# Patient Record
Sex: Male | Born: 2005 | Race: White | Hispanic: No | Marital: Single | Smoking: Never smoker
Health system: Southern US, Community
[De-identification: ages and names within clinical notes are randomized; demographics above are authoritative.]

## PROBLEM LIST (undated history)

## (undated) DIAGNOSIS — L309 Dermatitis, unspecified: Secondary | ICD-10-CM

## (undated) DIAGNOSIS — L709 Acne, unspecified: Secondary | ICD-10-CM

## (undated) HISTORY — DX: Dermatitis, unspecified: L30.9

## (undated) HISTORY — DX: Acne, unspecified: L70.9

---
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
## 2024-01-01 ENCOUNTER — Ambulatory Visit
Admission: EM | Admit: 2024-01-01 | Discharge: 2024-01-01 | Disposition: A | Discharge status: Home / Self Care | Payer: BC Managed Care – PPO | Attending: Emergency Medicine | Admitting: Emergency Medicine

## 2024-01-01 DIAGNOSIS — J020 Streptococcal pharyngitis: Secondary | ICD-10-CM

## 2024-01-01 LAB — POCT RAPID STREP A (OFFICE): Rapid Strep A Screen: POSITIVE — AB

## 2024-01-01 MED ORDER — AMOXICILLIN 500 MG PO CAPS: 500.0000 mg | ORAL_CAPSULE | Freq: Two times a day (BID) | ORAL | 0 refills | Status: AC

## 2024-01-01 NOTE — ED Provider Notes (Signed)
 Nathan Schwartz    CSN: [id number] Arrival date & time: 01/01/24  0801      History   Chief Complaint Chief Complaint  Patient presents with   Sore Throat    HPI Nathan Schwartz is a 18 y.o. male.  Patient presents with 1 day history of sore throat.  No fever, cough, shortness of breath.  No OTC medications taken today.  His brother had strep throat last week.  The history is provided by the patient and medical records.    Past Medical History:  Diagnosis Date   Acne    Eczema     There are no active problems to display for this patient.   History reviewed. No pertinent surgical history.     Home Medications    Prior to Admission medications   Medication Sig Start Date End Date Taking? Authorizing Provider  amoxicillin  (AMOXIL ) 500 MG capsule Take 1 capsule (500 mg total) by mouth 2 (two) times daily for 10 days. 01/01/24 01/11/24 Yes Wellington Half, NP  Adapalene-Benzoyl Per-Clindamy (CABTREO ) 0.15-3.1-1.2 % GEL Apply 1 Application topically at bedtime. 05/23/23   Artemio Larry, MD  clindamycin  (CLEOCIN -T) 1 % lotion Apply topically daily. Apply in the morning. 05/23/23 05/22/24  Artemio Larry, MD  Trifarotene  (AKLIEF ) 0.005 % CREA Apply 1 application  topically at bedtime. 05/23/23   Artemio Larry, MD    Family History History reviewed. No pertinent family history.  Social History Social History   Tobacco Use   Smoking status: Never   Smokeless tobacco: Never     Allergies   Patient has no known allergies.   Review of Systems Review of Systems  Constitutional:  Positive for chills. Negative for fever.  HENT:  Positive for sore throat. Negative for ear pain.   Respiratory:  Negative for cough and shortness of breath.      Physical Exam Triage Vital Signs ED Triage Vitals  Encounter Vitals Group     BP 01/01/24 0820 120/78     Systolic BP Percentile --      Diastolic BP Percentile --      Pulse Rate 01/01/24 0820 100     Resp 01/01/24 0820  18     Temp 01/01/24 0820 98.8 F (37.1 C)     Temp src --      SpO2 01/01/24 0820 96 %     Weight --      Height --      Head Circumference --      Peak Flow --      Pain Score 01/01/24 0823 7     Pain Loc --      Pain Education --      Exclude from Growth Chart --    No data found.  Updated Vital Signs BP 120/78   Pulse 100   Temp 98.8 F (37.1 C)   Resp 18   SpO2 96%   Visual Acuity Right Eye Distance:   Left Eye Distance:   Bilateral Distance:    Right Eye Near:   Left Eye Near:    Bilateral Near:     Physical Exam Constitutional:      General: He is not in acute distress. HENT:     Right Ear: Tympanic membrane normal.     Left Ear: Tympanic membrane normal.     Nose: Nose normal.     Mouth/Throat:     Mouth: Mucous membranes are moist.     Pharynx: Posterior oropharyngeal  erythema present.  Cardiovascular:     Rate and Rhythm: Normal rate and regular rhythm.     Heart sounds: Normal heart sounds.  Pulmonary:     Effort: Pulmonary effort is normal. No respiratory distress.     Breath sounds: Normal breath sounds.  Neurological:     Mental Status: He is alert.      UC Treatments / Results  Labs (all labs ordered are listed, but only abnormal results are displayed) Labs Reviewed  POCT RAPID STREP A (OFFICE) - Abnormal; Notable for the following components:      Result Value   Rapid Strep A Screen Positive (*)    All other components within normal limits    EKG   Radiology No results found.  Procedures Procedures (including critical care time)  Medications Ordered in UC Medications - No data to display  Initial Impression / Assessment and Plan / UC Course  I have reviewed the triage vital signs and the nursing notes.  Pertinent labs & imaging results that were available during my care of the patient were reviewed by me and considered in my medical decision making (see chart for details).    Strep pharyngitis.  Treating with  amoxicillin .  Discussed symptomatic treatment including Tylenol or ibuprofen.  Instructed patient to follow up with PCP if symptoms are not improving.  He agrees to plan of care.   Final Clinical Impressions(s) / UC Diagnoses   Final diagnoses:  Strep pharyngitis     Discharge Instructions      Take the amoxicillin  as directed.  Follow-up with your primary care provider if your symptoms are not improving.      ED Prescriptions     Medication Sig Dispense Auth. Provider   amoxicillin  (AMOXIL ) 500 MG capsule Take 1 capsule (500 mg total) by mouth 2 (two) times daily for 10 days. 20 capsule Wellington Half, NP      PDMP not reviewed this encounter.   Wellington Half, NP 01/01/24 [phone]

## 2024-01-01 NOTE — Discharge Instructions (Addendum)
 Take the amoxicillin as directed.  Follow up with your primary care provider if your symptoms are not improving.

## 2024-01-01 NOTE — ED Triage Notes (Signed)
 Patient to Urgent Care with complaints of sore throat. Denies any known fevers. Chills during the night and feeling hot. Multiple sick contacts including his brother who has had strep.   Symptoms started last night.

## 2024-07-22 ENCOUNTER — Ambulatory Visit: Admitting: Dermatology

## 2024-07-22 DIAGNOSIS — D224 Melanocytic nevi of scalp and neck: Secondary | ICD-10-CM | POA: Diagnosis not present

## 2024-07-22 DIAGNOSIS — D229 Melanocytic nevi, unspecified: Secondary | ICD-10-CM

## 2024-07-22 DIAGNOSIS — D485 Neoplasm of uncertain behavior of skin: Secondary | ICD-10-CM | POA: Diagnosis not present

## 2024-07-22 NOTE — Patient Instructions (Addendum)

## 2024-07-22 NOTE — Progress Notes (Addendum)
+    Follow-Up Visit   Subjective  Nathan Schwartz is a 18 y.o. male who presents for the following: growth in the scalp, present for years. Patient would like area removed, gets hit with haircuts.   The patient has spots, moles and lesions to be evaluated, some may be new or changing.  This patient is accompanied in the office by his girlfriend.   The following portions of the chart were reviewed this encounter and updated as appropriate: medications, allergies, medical history  Review of Systems:  No other skin or systemic complaints except as noted in HPI or Assessment and Plan.  Objective  Well appearing patient in no apparent distress; mood and affect are within normal limits.  A focused examination was performed of the following areas: Face, scalp  Relevant physical exam findings are noted in the Assessment and Plan.  left occipital scalp 1.1 cm fleshy papule   Assessment & Plan   NEOPLASM OF UNCERTAIN BEHAVIOR OF SKIN left occipital scalp Epidermal / dermal shaving  Lesion diameter (cm):  1.1 Informed consent: discussed and consent obtained   Patient was prepped and draped in usual sterile fashion: Area prepped with alcohol. 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 Discussed shave removal if bothersome. Patient may consider in future. Call clinic for new  or changing moles. Recommend daily use of broad spectrum spf 30+ sunscreen to sun-exposed areas.    Return if symptoms worsen or fail to improve.  IAndrea Kerns, CMA, am acting as scribe for Rexene Rattler, MD .   Documentation: I have reviewed the above documentation for accuracy and completeness, and I agree with the above.  Rexene Rattler, MD

## 2024-07-28 ENCOUNTER — Ambulatory Visit: Payer: Self-pay | Admitting: Dermatology

## 2024-07-28 LAB — SURGICAL PATHOLOGY

## 2024-07-29 NOTE — Telephone Encounter (Signed)
 Advised pt of bx results/sh ?

## 2024-07-29 NOTE — Telephone Encounter (Signed)
-----   Message from Rexene Rattler sent at 07/28/2024  6:25 PM EDT ----- 1. Skin, left occipital scalp :       MELANOCYTIC NEVUS, COMPOUND TYPE, BASE INVOLVED   Benign mole, may recur - please call patient ----- Message ----- From: Interface, Lab In [facility] One Sent: 07/28/2024   5:49 PM EDT To: Rexene Rattler, MD
